# Patient Record
Sex: Female | Born: 1945 | Race: White | Hispanic: No | Marital: Married | Smoking: Current every day smoker
Health system: Southern US, Community
[De-identification: ages and names within clinical notes are randomized; demographics above are authoritative.]

## PROBLEM LIST (undated history)

## (undated) DIAGNOSIS — I10 Essential (primary) hypertension: Secondary | ICD-10-CM

---

## 2014-03-02 ENCOUNTER — Encounter (HOSPITAL_COMMUNITY): Payer: Self-pay | Admitting: Emergency Medicine

## 2014-03-02 ENCOUNTER — Emergency Department (HOSPITAL_COMMUNITY): Payer: Medicare Other

## 2014-03-02 DIAGNOSIS — I10 Essential (primary) hypertension: Secondary | ICD-10-CM | POA: Insufficient documentation

## 2014-03-02 DIAGNOSIS — Y9229 Other specified public building as the place of occurrence of the external cause: Secondary | ICD-10-CM | POA: Insufficient documentation

## 2014-03-02 DIAGNOSIS — F17200 Nicotine dependence, unspecified, uncomplicated: Secondary | ICD-10-CM | POA: Insufficient documentation

## 2014-03-02 DIAGNOSIS — W108XXA Fall (on) (from) other stairs and steps, initial encounter: Secondary | ICD-10-CM | POA: Insufficient documentation

## 2014-03-02 DIAGNOSIS — Y939 Activity, unspecified: Secondary | ICD-10-CM | POA: Insufficient documentation

## 2014-03-02 DIAGNOSIS — S0280XA Fracture of other specified skull and facial bones, unspecified side, initial encounter for closed fracture: Secondary | ICD-10-CM | POA: Insufficient documentation

## 2014-03-02 NOTE — ED Notes (Signed)
C/o left knee pain. Pt sts that she was out to eat tonight. Was sitting at a booth and missed the step beside it. No deformity noticed.  Does have small abrasion to left knee cap

## 2014-03-03 ENCOUNTER — Emergency Department (HOSPITAL_COMMUNITY)
Admission: EM | Admit: 2014-03-03 | Discharge: 2014-03-03 | Disposition: A | Discharge status: Home / Self Care | Payer: Medicare Other | Attending: Emergency Medicine | Admitting: Emergency Medicine

## 2014-03-03 DIAGNOSIS — W010XXA Fall on same level from slipping, tripping and stumbling without subsequent striking against object, initial encounter: Secondary | ICD-10-CM

## 2014-03-03 DIAGNOSIS — S82002A Unspecified fracture of left patella, initial encounter for closed fracture: Secondary | ICD-10-CM

## 2014-03-03 HISTORY — DX: Essential (primary) hypertension: I10

## 2014-03-03 MED ORDER — IBUPROFEN 400 MG PO TABS
400.0000 mg | ORAL_TABLET | Freq: Once | ORAL | Status: AC
  Administered 2014-03-03: 400 mg via ORAL
  Filled 2014-03-03: qty 2

## 2014-03-03 MED ORDER — TRAMADOL HCL 50 MG PO TABS: 50.0000 mg | ORAL_TABLET | Freq: Four times a day (QID) | ORAL | Status: AC | PRN

## 2014-03-03 NOTE — ED Provider Notes (Signed)
Medical screening examination/treatment/procedure(s) were performed by non-physician practitioner and as supervising physician I was immediately available for consultation/collaboration.   EKG Interpretation None       Lilyann Gravelle M Evert Wenrich, MD 03/03/14 0737 

## 2014-03-03 NOTE — ED Provider Notes (Signed)
CSN: [id number]     Arrival date & time 03/02/14  2226 History   First MD Initiated Contact with Patient 03/03/14 0017     Chief Complaint  Patient presents with  . Knee Injury     (Consider location/radiation/quality/duration/timing/severity/associated sxs/prior Treatment) HPI Pt is a 68yo female presenting with left knee pain that started after fall in restaurant around 1030pm 03/02/14.  Pt states she was getting up from a table when she mis-stepped off a small step causing her to fall to the ground directly onto left knee. Pt states she did not hit head, or LOC prior to fall. Denies any other injuries during fall.  Pain in left knee is aching and sore, 2/10 at rest, but 9/10 aching sharp shooting pain with weight bearing and ambulation.  No pain medication PTA. Denies previous injury to same knee. No significant PMH.   Past Medical History  Diagnosis Date  . Hypertension    History reviewed. No pertinent past surgical history. No family history on file. History  Substance Use Topics  . Smoking status: Current Every Day Smoker  . Smokeless tobacco: Not on file  . Alcohol Use: No   OB History   Grav Para Term Preterm Abortions TAB SAB Ect Mult Living                 Review of Systems  Musculoskeletal: Positive for arthralgias. Negative for joint swelling and myalgias.       Left knee  Skin: Positive for wound. Negative for color change.  Neurological: Negative for weakness and numbness.  All other systems reviewed and are negative.      Allergies  Review of patient's allergies indicates no known allergies.  Home Medications   Current Outpatient Rx  Name  Route  Sig  Dispense  Refill  . traMADol (ULTRAM) 50 MG tablet   Oral   Take 1 tablet (50 mg total) by mouth every 6 (six) hours as needed.   15 tablet   0    BP 136/78  Pulse 89  Temp(Src) 98.3 F (36.8 C) (Oral)  Resp 16  Ht 5\' 6"  (1.676 m)  Wt 145 lb (65.772 kg)  BMI 23.41 kg/m2  SpO2 99% Physical Exam   Nursing note and vitals reviewed. Constitutional: She is oriented to person, place, and time. She appears well-developed and well-nourished.  HENT:  Head: Normocephalic and atraumatic.  Eyes: EOM are normal.  Neck: Normal range of motion.  Cardiovascular: Normal rate.   Pulmonary/Chest: Effort normal.  Musculoskeletal: She exhibits tenderness. She exhibits no edema.  Left knee: superficial abrasion over patella. No obvious deformity or edema. Mild tenderness over patella. No tenderness in medial or lateral joint space. Full knee extension. Decreased knee flexion to 120 degrees due to pain.  5/5 plantarflexion and dorsiflexion with left foot. Pain with weight bearing. Pedal pulse 2+  Neurological: She is alert and oriented to person, place, and time.  Sensation to light touch in tact distal to injury  Skin: Skin is warm and dry.  Superficial abrasion to anterior left knee over patella. No discharge or bleeding. No foreign bodies.  Psychiatric: She has a normal mood and affect. Her behavior is normal.     ED Course  Procedures (including critical care time) Labs Review Labs Reviewed - No data to display Imaging Review Dg Knee Complete 4 Views Left  03/03/2014   CLINICAL DATA:  Fall on knee.  EXAM: LEFT KNEE - COMPLETE 4+ VIEW  COMPARISON:  None.  FINDINGS: Transverse fracture through the patella, just below the midportion. The superficial margin of the fracture is mildly distracted. No displacement along the articular surface. As permitted by available projections, the patella appears normally seated. Atherosclerosis. No joint effusion.  IMPRESSION: Transverse patellar fracture without displacement along the articular surface.   Electronically Signed   By: Tiburcio PeaJonathan  Watts M.D.   On: 03/03/2014 00:25     EKG Interpretation None      MDM   Final diagnoses:  Fracture of patella, left, closed  Fall from slip, trip, or stumble    pt presenting with left knee pain after fall from  mis-step while at restaurant last night around 10:30pm (Saturday 3/7).  Left leg- neurovascularly in tact.  Plain films: transverse patellar fracture w/o displacement along articular surface. Will place pt in knee immobilizer, give crutches and tramadol as needed for pain. Pt states [place] is from out of town [place].  Advised to f/u with her PCP and and orthopedist next week for further evaluation and treatment. Pt verbalized understanding and agreement with tx plan.    Junius FinnerErin O'Malley, PA-C 03/03/14 [phone]

## 2014-03-03 NOTE — ED Notes (Signed)
Called x-ray for cd of films.

## 2015-05-07 IMAGING — CR DG KNEE COMPLETE 4+V*L*
4 series · 4 of 4 positions shown · non-contrast
Comparison: None.

CLINICAL DATA: Fall on knee.

EXAM:
LEFT KNEE - COMPLETE 4+ VIEW

[t knee ap left]
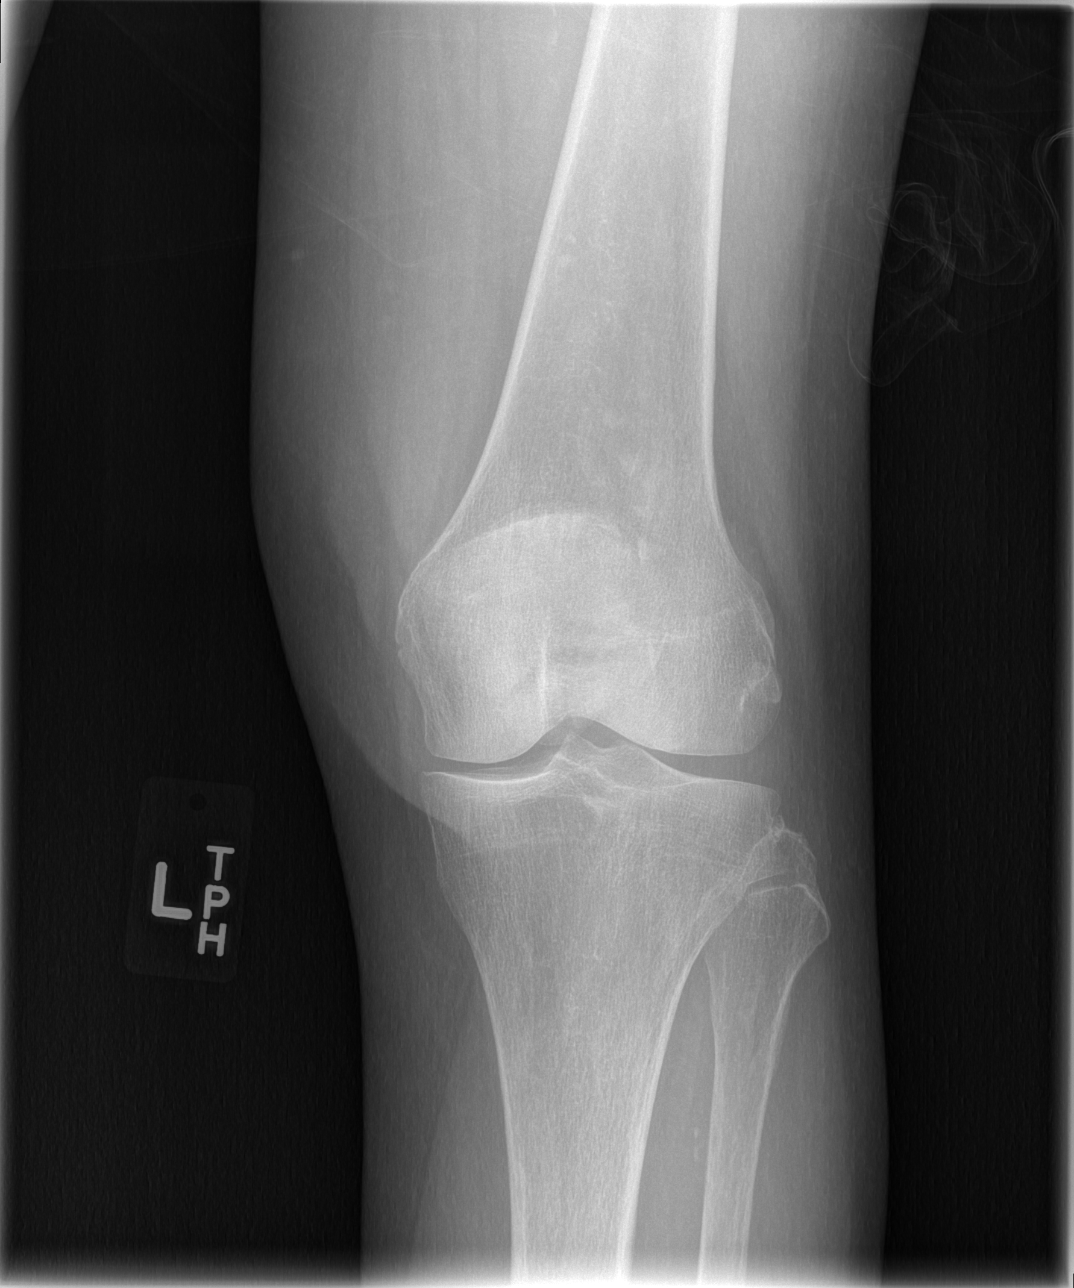

[t knee oblique left (1 of 2)]
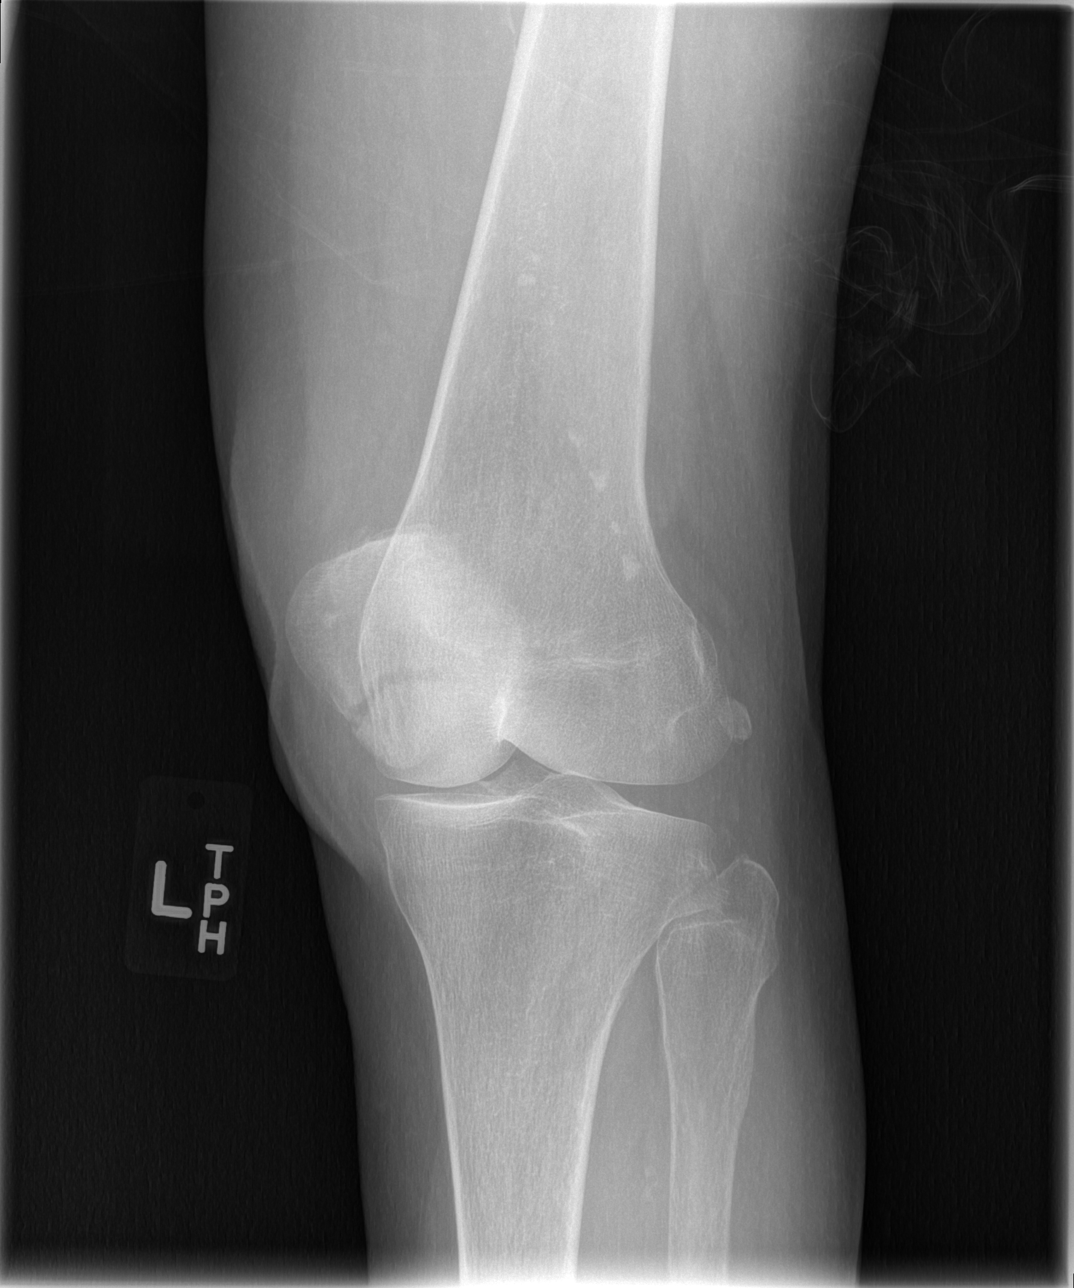

[t knee oblique left (2 of 2)]
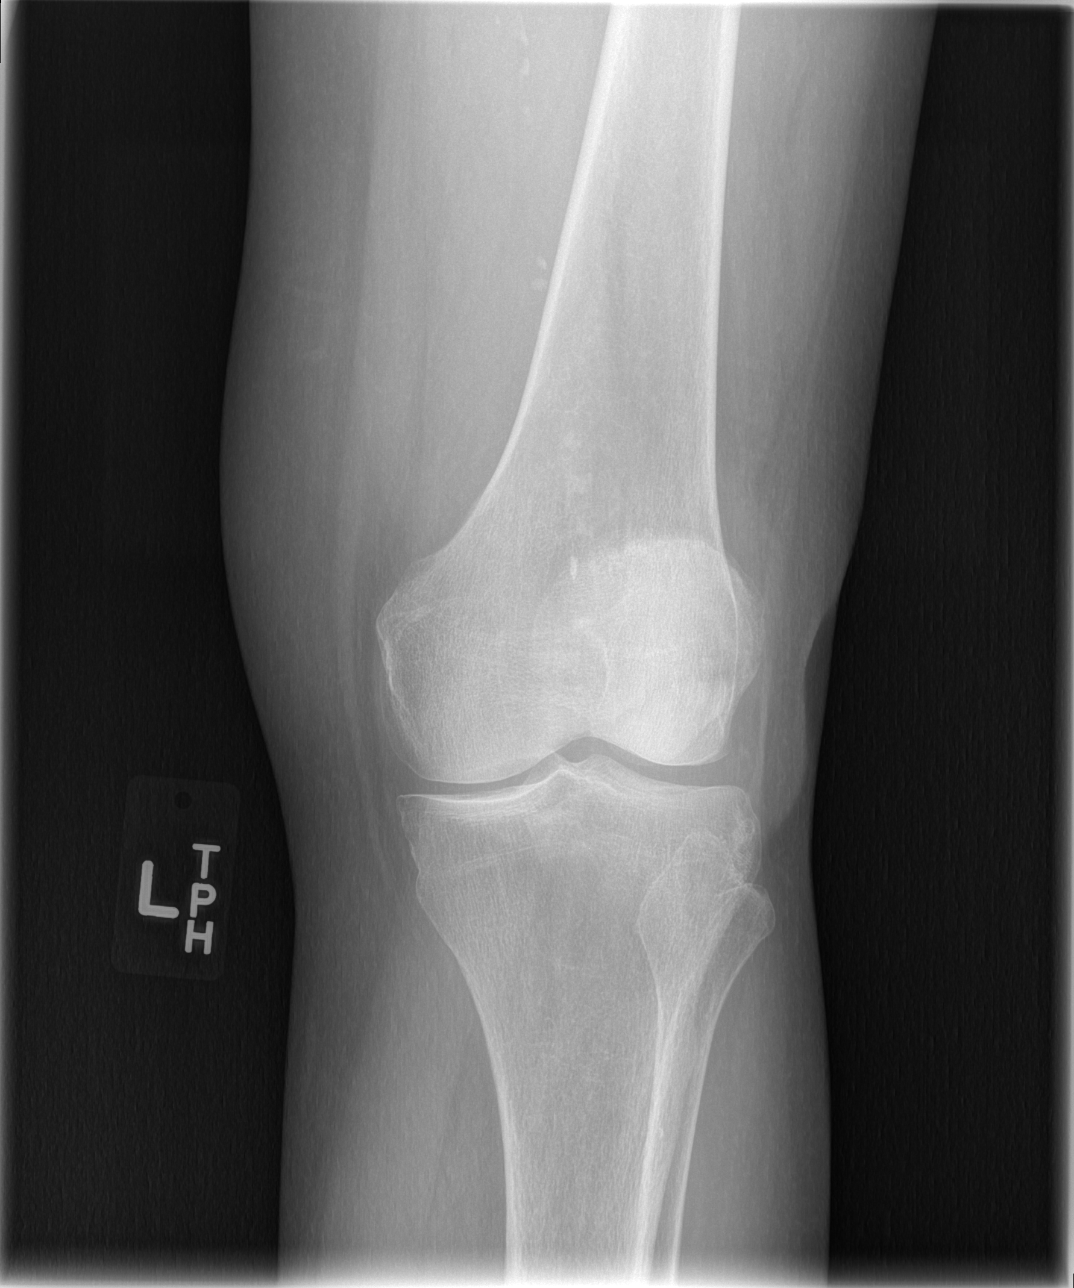

[t knee lat left]
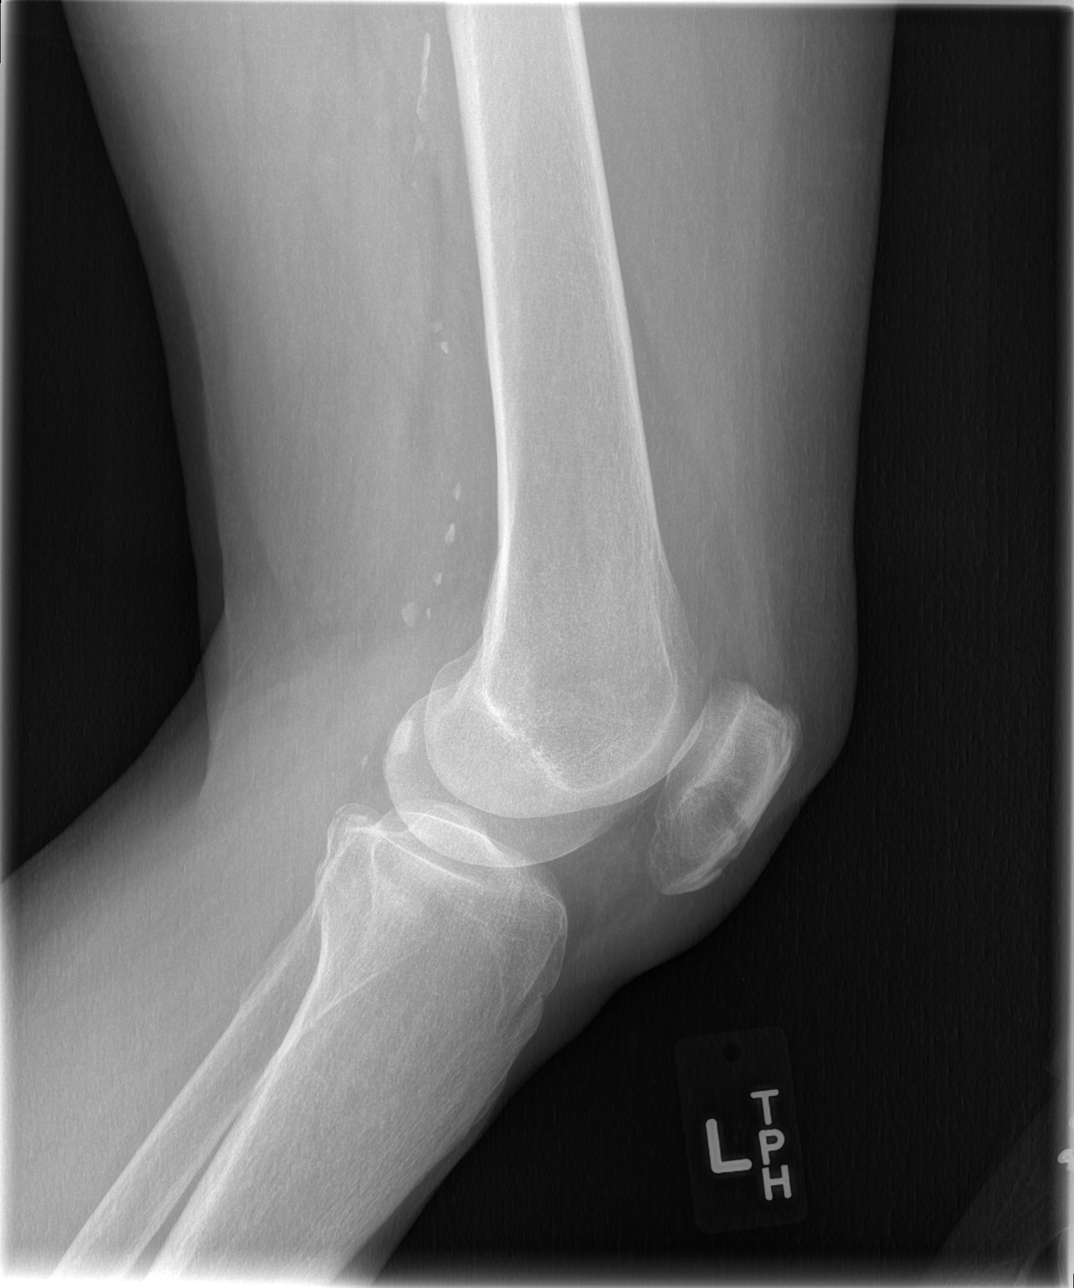

[4 of 4 positions shown; findings below may reference images not displayed]

FINDINGS: Transverse fracture through the patella, just below the midportion.
The superficial margin of the fracture is mildly distracted. No
displacement along the articular surface. As permitted by available
projections, the patella appears normally seated. Atherosclerosis.
No joint effusion.
IMPRESSION: Transverse patellar fracture without displacement along the
articular surface.

## 2022-07-27 DEATH — deceased
